# Patient Record
Sex: Female | Born: 1985 | Race: Black or African American | Hispanic: No | Marital: Single | Smoking: Current every day smoker
Health system: Southern US, Community
[De-identification: ages and names within clinical notes are randomized; demographics above are authoritative.]

## PROBLEM LIST (undated history)

## (undated) DIAGNOSIS — I10 Essential (primary) hypertension: Secondary | ICD-10-CM

## (undated) DIAGNOSIS — F99 Mental disorder, not otherwise specified: Secondary | ICD-10-CM

## (undated) DIAGNOSIS — R87619 Unspecified abnormal cytological findings in specimens from cervix uteri: Secondary | ICD-10-CM

## (undated) DIAGNOSIS — D649 Anemia, unspecified: Secondary | ICD-10-CM

## (undated) DIAGNOSIS — E559 Vitamin D deficiency, unspecified: Secondary | ICD-10-CM

## (undated) DIAGNOSIS — B009 Herpesviral infection, unspecified: Secondary | ICD-10-CM

## (undated) HISTORY — DX: Essential (primary) hypertension: I10

## (undated) HISTORY — DX: Mental disorder, not otherwise specified: F99

## (undated) HISTORY — DX: Herpesviral infection, unspecified: B00.9

---

## 2005-01-17 ENCOUNTER — Emergency Department: Payer: Self-pay | Admitting: Emergency Medicine

## 2005-01-18 ENCOUNTER — Ambulatory Visit: Payer: Self-pay | Admitting: Emergency Medicine

## 2005-04-05 ENCOUNTER — Emergency Department: Payer: Self-pay | Admitting: Emergency Medicine

## 2005-09-04 ENCOUNTER — Emergency Department: Payer: Self-pay | Admitting: Emergency Medicine

## 2005-12-26 ENCOUNTER — Emergency Department: Payer: Self-pay | Admitting: Emergency Medicine

## 2006-02-23 ENCOUNTER — Emergency Department: Payer: Self-pay | Admitting: Emergency Medicine

## 2006-06-01 ENCOUNTER — Observation Stay: Payer: Self-pay | Admitting: Obstetrics and Gynecology

## 2006-08-04 ENCOUNTER — Observation Stay: Payer: Self-pay

## 2006-08-12 ENCOUNTER — Observation Stay: Payer: Self-pay | Admitting: Obstetrics and Gynecology

## 2006-08-22 ENCOUNTER — Inpatient Hospital Stay: Payer: Self-pay

## 2007-01-09 ENCOUNTER — Emergency Department: Payer: Self-pay | Admitting: Emergency Medicine

## 2007-12-15 ENCOUNTER — Emergency Department (HOSPITAL_COMMUNITY): Admission: EM | Admit: 2007-12-15 | Discharge: 2007-12-15 | Payer: Self-pay | Admitting: Emergency Medicine

## 2008-05-05 ENCOUNTER — Emergency Department: Payer: Self-pay | Admitting: Emergency Medicine

## 2008-12-17 ENCOUNTER — Observation Stay: Payer: Self-pay | Admitting: Obstetrics and Gynecology

## 2008-12-19 ENCOUNTER — Observation Stay: Payer: Self-pay | Admitting: Obstetrics and Gynecology

## 2008-12-22 ENCOUNTER — Observation Stay: Payer: Self-pay | Admitting: Obstetrics and Gynecology

## 2009-02-02 ENCOUNTER — Observation Stay: Payer: Self-pay | Admitting: Obstetrics and Gynecology

## 2009-02-10 ENCOUNTER — Inpatient Hospital Stay: Payer: Self-pay

## 2011-06-20 LAB — URINALYSIS, ROUTINE W REFLEX MICROSCOPIC
Bilirubin Urine: NEGATIVE
Glucose, UA: NEGATIVE
Hgb urine dipstick: NEGATIVE
Ketones, ur: NEGATIVE
Nitrite: NEGATIVE
Protein, ur: NEGATIVE
Specific Gravity, Urine: 1.028
Urobilinogen, UA: 0.2
pH: 5.5

## 2011-06-20 LAB — PREGNANCY, URINE: Preg Test, Ur: NEGATIVE

## 2011-09-29 ENCOUNTER — Emergency Department: Payer: Self-pay | Admitting: Internal Medicine

## 2011-11-10 ENCOUNTER — Emergency Department: Payer: Self-pay | Admitting: Unknown Physician Specialty

## 2012-02-10 ENCOUNTER — Emergency Department: Payer: Self-pay | Admitting: *Deleted

## 2012-09-20 ENCOUNTER — Emergency Department: Payer: Self-pay | Admitting: Emergency Medicine

## 2013-11-21 ENCOUNTER — Emergency Department: Payer: Self-pay | Admitting: Internal Medicine

## 2013-11-23 ENCOUNTER — Emergency Department: Payer: Self-pay | Admitting: Emergency Medicine

## 2015-02-10 ENCOUNTER — Encounter: Payer: Self-pay | Admitting: Emergency Medicine

## 2015-02-10 ENCOUNTER — Emergency Department
Admission: EM | Admit: 2015-02-10 | Discharge: 2015-02-10 | Disposition: A | Discharge status: Home / Self Care | Payer: Worker's Compensation | Attending: Emergency Medicine | Admitting: Emergency Medicine

## 2015-02-10 DIAGNOSIS — S61031A Puncture wound without foreign body of right thumb without damage to nail, initial encounter: Secondary | ICD-10-CM | POA: Diagnosis not present

## 2015-02-10 DIAGNOSIS — Y998 Other external cause status: Secondary | ICD-10-CM | POA: Diagnosis not present

## 2015-02-10 DIAGNOSIS — Z720 Tobacco use: Secondary | ICD-10-CM | POA: Diagnosis not present

## 2015-02-10 DIAGNOSIS — Y9389 Activity, other specified: Secondary | ICD-10-CM | POA: Diagnosis not present

## 2015-02-10 DIAGNOSIS — W460XXA Contact with hypodermic needle, initial encounter: Secondary | ICD-10-CM | POA: Insufficient documentation

## 2015-02-10 DIAGNOSIS — Y9289 Other specified places as the place of occurrence of the external cause: Secondary | ICD-10-CM | POA: Insufficient documentation

## 2015-02-10 DIAGNOSIS — Z7721 Contact with and (suspected) exposure to potentially hazardous body fluids: Secondary | ICD-10-CM | POA: Diagnosis present

## 2015-02-10 DIAGNOSIS — IMO0002 Reserved for concepts with insufficient information to code with codable children: Secondary | ICD-10-CM

## 2015-02-10 HISTORY — DX: Vitamin D deficiency, unspecified: E55.9

## 2015-02-10 NOTE — ED Provider Notes (Signed)
[facility] Emergency Department Provider Note  ____________________________________________  Time seen: 7:40 PM  I have reviewed the triage vital signs and the nursing notes.   HISTORY  Chief Complaint Body Fluid Exposure    HPI Melody Li is a 29 y.o. female who was working at home. [employer] [place], when she sustained a needlestick injury to her right thumb. He was from an insulin flex pain that had just been used to give a patient a hypodermic insulin injection. The needle was not visibly contaminated. Ms. Katrinka BlazingSmith has no knowledge of the resident's hepatitis or HIV status. She denies any medical history, except for bronchitis. She has mild pain in the right thumb, similar to a glucometer check per her description, without any other symptoms     Past Medical History  Diagnosis Date  . Vitamin D deficiency     There are no active problems to display for this patient.   History reviewed. No pertinent past surgical history.  No current outpatient prescriptions on file.  Allergies Review of patient's allergies indicates no known allergies.  No family history on file.  Social History History  Substance Use Topics  . Smoking status: Current Every Day Smoker -- 1.00 packs/day    Types: Cigarettes  . Smokeless tobacco: Never Used  . Alcohol Use: Yes    Review of Systems  Constitutional: No fever or chills. No weight changes Eyes:No blurry vision or double vision.  ENT: No sore throat. Cardiovascular: No chest pain. Respiratory: No dyspnea or cough. Gastrointestinal: Negative for abdominal pain, vomiting and diarrhea.  No BRBPR or melena. Genitourinary: Negative for dysuria, urinary retention, bloody urine, or difficulty urinating. Musculoskeletal: Negative for back pain. No joint swelling or pain. Skin: Negative for rash. Neurological: Negative for headaches, focal weakness or numbness. Psychiatric:No anxiety or depression.    Endocrine:No hot/cold intolerance, changes in energy, or sleep difficulty.  10-point ROS otherwise negative.  ____________________________________________   PHYSICAL EXAM:  VITAL SIGNS: ED Triage Vitals  Enc Vitals Group     BP 02/10/15 1927 151/91 mmHg     Pulse Rate 02/10/15 1927 91     Resp 02/10/15 1927 18     Temp 02/10/15 1927 98.3 F (36.8 C)     Temp Source 02/10/15 1927 Oral     SpO2 02/10/15 1927 95 %     Weight 02/10/15 1927 181 lb (82.101 kg)     Height 02/10/15 1927 5\' 2"  (1.575 m)     Head Cir --      Peak Flow --      Pain Score --      Pain Loc --      Pain Edu? --      Excl. in GC? --      Constitutional: Alert and oriented. Well appearing and in no distress. Eyes: No scleral icterus. No conjunctival pallor. PERRL. EOMI ENT   Head: Normocephalic and atraumatic.  Musculoskeletal: Right upper extremity, hand and thumb all unremarkable. No visible injury or bleeding. Good perfusion Neurologic:   Normal speech and language.  Motor grossly intact. Normal gait. No gross focal neurologic deficits are appreciated.  Skin:  Skin is warm, dry and intact. No rash noted.  Psychiatric: Mood and affect are normal. Speech and behavior are normal. Patient exhibits appropriate insight and judgment. Not intoxicated. Normal interaction  ____________________________________________    LABS (pertinent positives/negatives) (all labs ordered are listed, but only abnormal results are displayed) Labs Reviewed - No data to display ____________________________________________  EKG    ____________________________________________    RADIOLOGY    ____________________________________________   PROCEDURES  ____________________________________________   INITIAL IMPRESSION / ASSESSMENT AND PLAN / ED COURSE  Pertinent labs & imaging results that were available during my care of the patient were reviewed by me and considered in my medical decision making  (see chart for details).  The patient presents with a low risk needle stick injury. This was a very small bore needle that is not used for blood draws and factors. He is only to use insulin into a hypodermic on the source resident. There is extremely low likelihood that any source resident. Blood was retained in the needle and then injected into our patient. Additionally, the needle stick injury to her patient's thumb appears to be very superficial and may not even extended all the way through the skin layer. I counseled the patient on these aspects of the injury and recommend against post exposure prophylaxis for HIV.  We will send labs via [facility] per protocol to get baseline and follow-up testing for HIV and hepatitis panels. I have low suspicion of any significant transmission even if the source resident currently has such infections.  There is no evidence of any functional impairment that may have clouded. The patient's judgment or sensorium and predisposed her to an injury. She is not intoxicated, is alert, mentally sharp and has appropriate insight and judgment.  ____________________________________________   FINAL CLINICAL IMPRESSION(S) / ED DIAGNOSES  Final diagnoses:  None   needlestick injury, possible body fluid exposure    Sharman CheekPhillip Davonne Jarnigan, MD 02/10/15 2023

## 2015-02-10 NOTE — ED Notes (Signed)
[facility] to right thumb while giving an insulin shot with flex-pen on a resident

## 2015-02-10 NOTE — ED Notes (Signed)
Patient with no complaints at this time. Respirations even and unlabored. Skin warm/dry. Discharge instructions reviewed with patient at this time. Patient given opportunity to voice concerns/ask questions. Patient discharged at this time and left Emergency Department with steady gait.   

## 2015-02-10 NOTE — Discharge Instructions (Signed)
Needle Stick Injury  A needle stick injury occurs when you are stuck by a needle that may have the blood from another person on it. Most of the time these injuries heal without any problem, but several diseases can be transmitted this way. You should be aware of the risks. A needle stick injury can cause risk for getting:  · Hepatitis B.  · Hepatitis C.  · HIV infection (the virus that causes AIDS).  The chance of getting one of these infections from a needle stick injury is small. However, it is important to take proper precautions to prevent such an injury. It is also important to understand and follow some health care recommendations when such an injury occurs.   RISK FACTORS  In general, the risk of infection after a needle stick injury appears to be higher with:  · Exposure to a needle that is visibly contaminated with blood.  · Exposure to the blood of a patient with an advanced disease or with a high viral load.  · A deep injury.  · Needle placement in a vein or artery.  PREVENTION   All health care workers should:  · Wash their hands often, including before and after caring for each patient.  · Receive the hepatitis B vaccine before any possible exposure to blood or bloody body fluids.  · Use personal protective equipment (PPE) when appropriate. This includes:  ¨ Gloves.  ¨ Gowns.  ¨ Boots.  ¨ Shoe covers.  ¨ Eyewear.  ¨ Masks.  · Wear gloves when any kind of venous or arterial access is being done.  · Use safety devices when available.  · Use sharp edges and needles with caution.  · Dispose of used needles and other sharps in puncture proof receptacles.  · Never recap needles.  TREATMENT   · After a needle stick injury, immediate cleansing with soap and water or an alcohol-based hand hygiene agent is needed.  · If you did not have a tetanus booster within the past 10 years, a booster shot should be given.  · If the puncture site becomes red, swollen, more painful, or drains yellowish-white fluid (pus),  medicine for a bacterial infection may be needed.  · If the blood on the needle is known or thought to be high risk for hepatitis B or HIV, additional treatment is needed.  · For needle stick exposures to the HIV virus, drug treatment is advised. This treatment is called post-exposure prophylaxis (PEP) and should be started as soon as possible following the injury. The recommended period of treatment with medicines is usually 4 weeks with 2 or more different drugs. You should have follow-up counseling and a medical evaluation, including HIV blood tests, right away. The tests should be repeated at 6 weeks, 3 months, and 6 months. Blood tests to monitor for drug toxicity effects of the PEP medicines are usually recommended immediately before treatment starts and again at 2 weeks and 4 weeks after the start of PEP. Additional recommendations during the first 6 to 12 weeks after exposure include:  ¨ Practicing sexual abstinence or using condoms to prevent sexual transmission and to avoid pregnancy.  ¨ Refraining from donating blood, plasma, semen, organs, or other tissue.  ¨ For breastfeeding women, considering temporary discontinuation of breastfeeding while on PEP.  · For needle stick exposures to hepatitis B, blood testing and PEP is also needed. If you have not been vaccinated against hepatitis B, this vaccine series should be started and hepatitis B immune   globulin should also be given. If you have been previously vaccinated, your status of immunity to infection with hepatitis B can be tested by a blood antibody test. Before or after those test results are available, repeat vaccination with or without hepatitis B immune globulin will be considered.  · Unfortunately, no helpful treatment following hepatitis C exposure has been identified or is recommended. Follow-up blood testing is advised over a period of 4 weeks to 6 months to determine if the needle stick led to an infection. Ask your caregiver for advice about  this follow-up testing.  HOME CARE INSTRUCTIONS  · Take medicine exactly as told by your caregiver.  · Keep all follow-up appointments.  · Do not share personal hygiene items.  SEEK IMMEDIATE MEDICAL CARE IF:  · You have concerns about your injury, treatment, or follow-up.  · The injury site becomes red, swollen, or painful.  · The injury site drains pus.  MAKE SURE YOU:  · Understand these instructions.  · Will watch your condition.  · Will get help right away if you are not doing well or get worse.  Document Released: 09/12/2005 Document Revised: 01/27/2014 Document Reviewed: 02/15/2011  [employer]® Patient Information ©2015 [employer]. This information is not intended to replace advice given to you by your health care provider. Make sure you discuss any questions you have with your health care provider.

## 2015-02-10 NOTE — ED Notes (Signed)
[employer] Bloodborne [facility].

## 2015-06-01 ENCOUNTER — Emergency Department
Admission: EM | Admit: 2015-06-01 | Discharge: 2015-06-01 | Disposition: A | Discharge status: Home / Self Care | Payer: BC Managed Care – PPO | Attending: Emergency Medicine | Admitting: Emergency Medicine

## 2015-06-01 ENCOUNTER — Encounter: Payer: Self-pay | Admitting: Emergency Medicine

## 2015-06-01 DIAGNOSIS — S99921A Unspecified injury of right foot, initial encounter: Secondary | ICD-10-CM | POA: Diagnosis present

## 2015-06-01 DIAGNOSIS — Y92007 Garden or yard of unspecified non-institutional (private) residence as the place of occurrence of the external cause: Secondary | ICD-10-CM | POA: Insufficient documentation

## 2015-06-01 DIAGNOSIS — S93401A Sprain of unspecified ligament of right ankle, initial encounter: Secondary | ICD-10-CM | POA: Diagnosis not present

## 2015-06-01 DIAGNOSIS — Z720 Tobacco use: Secondary | ICD-10-CM | POA: Insufficient documentation

## 2015-06-01 DIAGNOSIS — S93601A Unspecified sprain of right foot, initial encounter: Secondary | ICD-10-CM | POA: Diagnosis not present

## 2015-06-01 DIAGNOSIS — Y9302 Activity, running: Secondary | ICD-10-CM | POA: Diagnosis not present

## 2015-06-01 DIAGNOSIS — W1841XA Slipping, tripping and stumbling without falling due to stepping on object, initial encounter: Secondary | ICD-10-CM | POA: Insufficient documentation

## 2015-06-01 DIAGNOSIS — S93699A Other sprain of unspecified foot, initial encounter: Secondary | ICD-10-CM

## 2015-06-01 DIAGNOSIS — Y998 Other external cause status: Secondary | ICD-10-CM | POA: Diagnosis not present

## 2015-06-01 HISTORY — DX: Unspecified abnormal cytological findings in specimens from cervix uteri: R87.619

## 2015-06-01 HISTORY — DX: Anemia, unspecified: D64.9

## 2015-06-01 NOTE — ED Notes (Signed)
Reports right foot pain.  Stepped on something wrong a month ago.  Ambulates well

## 2015-06-01 NOTE — ED Provider Notes (Signed)
[facility] Emergency Department Provider Note ____________________________________________  Time seen: 0825   I have reviewed the triage vital signs and the nursing notes.  HISTORY  Chief Complaint  Foot Injury  HPI Melody Li is a 29 y.o. female reports to the ED for continued right plantar surface pain since she injured her foot better than a month ago.She describes she was running across the yard, when she inadvertently stepped on a rock that was in the yard causing pain to the bottom of the foot and a slight sprain to the ankle. She wrapped her ankle with Ace wrap and within a few days that had resolved. She continued since that time to have tenderness to the plantar surface of the heel, as well as pain across the arch of the foot. She also has noted and is visibly tender plantar surface of the foot upon awakening in the morning. She reports that her symptoms seem to be worsened when she pushes off with stepping as well as when she pulls her toes up towards her. Been dosing ibuprofen and Tylenol for pain relief. She is here for evaluation treatment of the symptoms.  Past Medical History  Diagnosis Date  . Vitamin D deficiency   . Abnormal Pap smear of cervix   . Vitamin D deficiency   . Anemia     There are no active problems to display for this patient.   History reviewed. No pertinent past surgical history.  No current outpatient prescriptions on file.  Allergies Review of patient's allergies indicates no known allergies.  History reviewed. No pertinent family history.  Social History Social History  Substance Use Topics  . Smoking status: Current Every Day Smoker -- 1.00 packs/day    Types: Cigarettes  . Smokeless tobacco: Never Used  . Alcohol Use: Yes   Review of Systems  Constitutional: Negative for fever. Eyes: Negative for visual changes. ENT: Negative for sore throat. Cardiovascular: Negative for chest pain. Respiratory:  Negative for shortness of breath. Gastrointestinal: Negative for abdominal pain, vomiting and diarrhea. Genitourinary: Negative for dysuria. Musculoskeletal: Negative for back pain. Right heel pain as above. Skin: Negative for rash. Neurological: Negative for headaches, focal weakness or numbness. ____________________________________________  PHYSICAL EXAM:  VITAL SIGNS: ED Triage Vitals  Enc Vitals Group     BP 06/01/15 0756 135/89 mmHg     Pulse Rate 06/01/15 0756 90     Resp 06/01/15 0756 20     Temp 06/01/15 0756 98.3 F (36.8 C)     Temp Source 06/01/15 0756 Oral     SpO2 06/01/15 0756 99 %     Weight 06/01/15 0756 178 lb (80.74 kg)     Height 06/01/15 0756 5\' 1"  (1.549 m)     Head Cir --      Peak Flow --      Pain Score 06/01/15 0753 7     Pain Loc --      Pain Edu? --      Excl. in GC? --    Constitutional: Alert and oriented. Well appearing and in no distress. Eyes: Conjunctivae are normal. PERRL. Normal extraocular movements. ENT   Head: Normocephalic and atraumatic.   Nose: No congestion/rhinorrhea.   Mouth/Throat: Mucous membranes are moist.   Neck: Supple. No thyromegaly. Hematological/Lymphatic/Immunological: No cervical lymphadenopathy. Cardiovascular: Normal rate, regular rhythm.  Respiratory: Normal respiratory effort. No wheezes/rales/rhonchi. Gastrointestinal: Soft and nontender. No distention. Musculoskeletal: Right foot and ankle without deformity, effusion, or bruising. Tender to palp over  the plantar surface of the heel. Tenderness with stretching of the plantar fascia. Nontender with normal range of motion in all extremities.  Neurologic:  Normal gait without ataxia. Normal speech and language. No gross focal neurologic deficits are appreciated. Skin:  Skin is warm, dry and intact. No rash noted. Psychiatric: Mood and affect are normal. Patient exhibits appropriate insight and  judgment. ________________________________________________  INITIAL IMPRESSION / ASSESSMENT AND PLAN / ED COURSE  Acute traumatic plantar fasciitis due to a remote contusion. Suggest over-the-counter heel lifts and a more rigid shoe to ambulate. Also gave instruction on plantar fasciitis management using ice massage, tissue mobilization, and stretching. Patient is referred to Dr. Recardo Evangelist for further treatment. ____________________________________________  FINAL CLINICAL IMPRESSION(S) / ED DIAGNOSES  Final diagnoses:  Traumatic plantar fasciitis     Lissa Hoard, PA-C 06/01/15 1630  Jennye Moccasin, MD 06/03/15 [phone]

## 2015-06-01 NOTE — Discharge Instructions (Signed)
Plantar Fasciitis (Heel Spur Syndrome) with Rehab The plantar fascia is a fibrous, ligament-like, soft-tissue structure that spans the bottom of the foot. Plantar fasciitis is a condition that causes pain in the foot due to inflammation of the tissue. SYMPTOMS   Pain and tenderness on the underneath side of the foot.  Pain that worsens with standing or walking. CAUSES  Plantar fasciitis is caused by irritation and injury to the plantar fascia on the underneath side of the foot. Common mechanisms of injury include:  Direct trauma to bottom of the foot.  Damage to a small nerve that runs under the foot where the main fascia attaches to the heel bone.  Stress placed on the plantar fascia due to bone spurs. RISK INCREASES WITH:   Activities that place stress on the plantar fascia (running, jumping, pivoting, or cutting).  Poor strength and flexibility.  Improperly fitted shoes.  Tight calf muscles.  Flat feet.  Failure to warm-up properly before activity.  Obesity. PREVENTION  Warm up and stretch properly before activity.  Allow for adequate recovery between workouts.  Maintain physical fitness:  Strength, flexibility, and endurance.  Cardiovascular fitness.  Maintain a health body weight.  Avoid stress on the plantar fascia.  Wear properly fitted shoes, including arch supports for individuals who have flat feet. PROGNOSIS  If treated properly, then the symptoms of plantar fasciitis usually resolve without surgery. However, occasionally surgery is necessary. RELATED COMPLICATIONS   Recurrent symptoms that may result in a chronic condition.  Problems of the lower back that are caused by compensating for the injury, such as limping.  Pain or weakness of the foot during push-off following surgery.  Chronic inflammation, scarring, and partial or complete fascia tear, occurring more often from repeated injections. TREATMENT  Treatment initially involves the use of  ice and medication to help reduce pain and inflammation. The use of strengthening and stretching exercises may help reduce pain with activity, especially stretches of the Achilles tendon. These exercises may be performed at home or with a therapist. Your caregiver may recommend that you use heel cups of arch supports to help reduce stress on the plantar fascia. Occasionally, corticosteroid injections are given to reduce inflammation. If symptoms persist for greater than 6 months despite non-surgical (conservative), then surgery may be recommended.  MEDICATION   If pain medication is necessary, then nonsteroidal anti-inflammatory medications, such as aspirin and ibuprofen, or other minor pain relievers, such as acetaminophen, are often recommended.  Do not take pain medication within 7 days before surgery.  Prescription pain relievers may be given if deemed necessary by your caregiver. Use only as directed and only as much as you need.  Corticosteroid injections may be given by your caregiver. These injections should be reserved for the most serious cases, because they may only be given a certain number of times. HEAT AND COLD  Cold treatment (icing) relieves pain and reduces inflammation. Cold treatment should be applied for 10 to 15 minutes every 2 to 3 hours for inflammation and pain and immediately after any activity that aggravates your symptoms. Use ice packs or massage the area with a piece of ice (ice massage).  Heat treatment may be used prior to performing the stretching and strengthening activities prescribed by your caregiver, physical therapist, or athletic trainer. Use a heat pack or soak the injury in warm water. SEEK IMMEDIATE MEDICAL CARE IF:  Treatment seems to offer no benefit, or the condition worsens.  Any medications produce adverse side effects. EXERCISES RANGE  OF MOTION (ROM) AND STRETCHING EXERCISES - Plantar Fasciitis (Heel Spur Syndrome) These exercises may help you  when beginning to rehabilitate your injury. Your symptoms may resolve with or without further involvement from your physician, physical therapist or athletic trainer. While completing these exercises, remember:   Restoring tissue flexibility helps normal motion to return to the joints. This allows healthier, less painful movement and activity.  An effective stretch should be held for at least 30 seconds.  A stretch should never be painful. You should only feel a gentle lengthening or release in the stretched tissue. RANGE OF MOTION - Toe Extension, Flexion  Sit with your right / left leg crossed over your opposite knee.  Grasp your toes and gently pull them back toward the top of your foot. You should feel a stretch on the bottom of your toes and/or foot.  Hold this stretch for __________ seconds.  Now, gently pull your toes toward the bottom of your foot. You should feel a stretch on the top of your toes and or foot.  Hold this stretch for __________ seconds. Repeat __________ times. Complete this stretch __________ times per day.  RANGE OF MOTION - Ankle Dorsiflexion, Active Assisted  Remove shoes and sit on a chair that is preferably not on a carpeted surface.  Place right / left foot under knee. Extend your opposite leg for support.  Keeping your heel down, slide your right / left foot back toward the chair until you feel a stretch at your ankle or calf. If you do not feel a stretch, slide your bottom forward to the edge of the chair, while still keeping your heel down.  Hold this stretch for __________ seconds. Repeat __________ times. Complete this stretch __________ times per day.  STRETCH - Gastroc, Standing  Place hands on wall.  Extend right / left leg, keeping the front knee somewhat bent.  Slightly point your toes inward on your back foot.  Keeping your right / left heel on the floor and your knee straight, shift your weight toward the wall, not allowing your back to  arch.  You should feel a gentle stretch in the right / left calf. Hold this position for __________ seconds. Repeat __________ times. Complete this stretch __________ times per day. STRETCH - Soleus, Standing  Place hands on wall.  Extend right / left leg, keeping the other knee somewhat bent.  Slightly point your toes inward on your back foot.  Keep your right / left heel on the floor, bend your back knee, and slightly shift your weight over the back leg so that you feel a gentle stretch deep in your back calf.  Hold this position for __________ seconds. Repeat __________ times. Complete this stretch __________ times per day. STRETCH - Gastrocsoleus, Standing  Note: This exercise can place a lot of stress on your foot and ankle. Please complete this exercise only if specifically instructed by your caregiver.   Place the ball of your right / left foot on a step, keeping your other foot firmly on the same step.  Hold on to the wall or a rail for balance.  Slowly lift your other foot, allowing your body weight to press your heel down over the edge of the step.  You should feel a stretch in your right / left calf.  Hold this position for __________ seconds.  Repeat this exercise with a slight bend in your right / left knee. Repeat __________ times. Complete this stretch __________ times per day.  STRENGTHENING EXERCISES - Plantar Fasciitis (Heel Spur Syndrome)  These exercises may help you when beginning to rehabilitate your injury. They may resolve your symptoms with or without further involvement from your physician, physical therapist or athletic trainer. While completing these exercises, remember:   Muscles can gain both the endurance and the strength needed for everyday activities through controlled exercises.  Complete these exercises as instructed by your physician, physical therapist or athletic trainer. Progress the resistance and repetitions only as guided. STRENGTH -  Towel Curls  Sit in a chair positioned on a non-carpeted surface.  Place your foot on a towel, keeping your heel on the floor.  Pull the towel toward your heel by only curling your toes. Keep your heel on the floor.  If instructed by your physician, physical therapist or athletic trainer, add ____________________ at the end of the towel. Repeat __________ times. Complete this exercise __________ times per day. STRENGTH - Ankle Inversion  Secure one end of a rubber exercise band/tubing to a fixed object (table, pole). Loop the other end around your foot just before your toes.  Place your fists between your knees. This will focus your strengthening at your ankle.  Slowly, pull your big toe up and in, making sure the band/tubing is positioned to resist the entire motion.  Hold this position for __________ seconds.  Have your muscles resist the band/tubing as it slowly pulls your foot back to the starting position. Repeat __________ times. Complete this exercises __________ times per day.  Document Released: 09/12/2005 Document Revised: 12/05/2011 Document Reviewed: 12/25/2008 [facility] Patient Information 2015 [place], [place]. This information is not intended to replace advice given to you by your health care provider. Make sure you discuss any questions you have with your health care provider.  Your condition is caused by you bruising and contusing the heel.  Consider OTC heel lifts as discussed. Also find a shoe with a thicker (firmer) sole. Use a frozen water bottle to massage the foot as demonstrated.

## 2016-11-14 ENCOUNTER — Emergency Department
Admission: EM | Admit: 2016-11-14 | Discharge: 2016-11-14 | Disposition: A | Discharge status: Home / Self Care | Payer: BC Managed Care – PPO | Attending: Emergency Medicine | Admitting: Emergency Medicine

## 2016-11-14 ENCOUNTER — Encounter: Payer: Self-pay | Admitting: Emergency Medicine

## 2016-11-14 DIAGNOSIS — F17210 Nicotine dependence, cigarettes, uncomplicated: Secondary | ICD-10-CM | POA: Diagnosis not present

## 2016-11-14 DIAGNOSIS — R05 Cough: Secondary | ICD-10-CM | POA: Diagnosis present

## 2016-11-14 DIAGNOSIS — J111 Influenza due to unidentified influenza virus with other respiratory manifestations: Secondary | ICD-10-CM | POA: Insufficient documentation

## 2016-11-14 MED ORDER — HYDROCOD POLST-CPM POLST ER 10-8 MG/5ML PO SUER
5.0000 mL | Freq: Once | ORAL | Status: AC
  Administered 2016-11-14: 5 mL via ORAL
  Filled 2016-11-14: qty 5

## 2016-11-14 MED ORDER — HYDROCOD POLST-CPM POLST ER 10-8 MG/5ML PO SUER: 5.0000 mL | Freq: Two times a day (BID) | ORAL | 0 refills | Status: DC

## 2016-11-14 MED ORDER — ALBUTEROL SULFATE HFA 108 (90 BASE) MCG/ACT IN AERS: 2.0000 | INHALATION_SPRAY | Freq: Four times a day (QID) | RESPIRATORY_TRACT | 2 refills | Status: DC | PRN

## 2016-11-14 MED ORDER — IPRATROPIUM-ALBUTEROL 0.5-2.5 (3) MG/3ML IN SOLN
3.0000 mL | Freq: Once | RESPIRATORY_TRACT | Status: AC
  Administered 2016-11-14: 3 mL via RESPIRATORY_TRACT
  Filled 2016-11-14: qty 3

## 2016-11-14 MED ORDER — OSELTAMIVIR PHOSPHATE 75 MG PO CAPS: 75.0000 mg | ORAL_CAPSULE | Freq: Two times a day (BID) | ORAL | 0 refills | Status: DC

## 2016-11-14 MED ORDER — HYDROCODONE-ACETAMINOPHEN 5-325 MG PO TABS: 1.0000 | ORAL_TABLET | ORAL | 0 refills | Status: DC | PRN

## 2016-11-14 MED ORDER — ACETAMINOPHEN 500 MG PO TABS
1000.0000 mg | ORAL_TABLET | Freq: Once | ORAL | Status: AC
  Administered 2016-11-14: 1000 mg via ORAL
  Filled 2016-11-14: qty 2

## 2016-11-14 MED ORDER — OSELTAMIVIR PHOSPHATE 75 MG PO CAPS: 75.0000 mg | ORAL_CAPSULE | Freq: Two times a day (BID) | ORAL | 0 refills | Status: AC

## 2016-11-14 NOTE — ED Notes (Signed)
See triage note  States she developed headache about 2 days ago  Now fever and cough with bodyaches

## 2016-11-14 NOTE — ED Triage Notes (Signed)
Pt to ed with c/o cough, congestion, and fever x 2 days.

## 2016-11-14 NOTE — Discharge Instructions (Signed)
Follow-up with your primary care doctor if not improving in 3 days. Continue taking ibuprofen for aches and fever. Tussionex 1 teaspoon twice a day for cough. This medication contains narcotics and do not take this medication and drive or operate machinery. Begin taking Tamiflu today. Continue taking Tylenol or ibuprofen for muscle aches and fever. Use inhaler as needed for cough or wheezing.

## 2016-11-14 NOTE — ED Provider Notes (Signed)
[facility] Emergency Department Provider Note   ____________________________________________   First MD Initiated Contact with Patient 11/14/16 [phone]     (approximate)  I have reviewed the triage vital signs and the nursing notes.   HISTORY  Chief Complaint Cough and Fever    HPI Melody Li is a 31 y.o. female is in the emergency room with complaint of severe cough, congestion and fever for the last 2 days. Patient states that everyone in the family has the flu. Patient also complains of a headache and generalized body aches. Patient been taking over-the-counter medication without any relief. Patient is a smoker at one half pack of cigarettes per day but has been unable to smoke since she got the flu. She states that she has had bronchitis in the past. Currently she rates her pain as a 10 over 10. Patient wants something to stop her cough immediately. She states that her brother dropped her off in the emergency room.   Past Medical History:  Diagnosis Date  . Abnormal Pap smear of cervix   . Anemia   . Vitamin D deficiency   . Vitamin D deficiency     There are no active problems to display for this patient.   History reviewed. No pertinent surgical history.  Prior to Admission medications   Medication Sig Start Date End Date Taking? Authorizing Provider  Cholecalciferol (VITAMIN D3) 5000 units TABS Take by mouth.   Yes Historical Provider, MD  metoprolol-hydrochlorothiazide (LOPRESSOR HCT) 50-25 MG tablet Take 1 tablet by mouth daily.   Yes Historical Provider, MD  albuterol (PROVENTIL HFA;VENTOLIN HFA) 108 (90 Base) MCG/ACT inhaler Inhale 2 puffs into the lungs every 6 (six) hours as needed for wheezing or shortness of breath. 11/14/16   Tommi Rumps, PA-C  chlorpheniramine-HYDROcodone (TUSSIONEX PENNKINETIC ER) 10-8 MG/5ML SUER Take 5 mLs by mouth 2 (two) times daily. 11/14/16   Tommi Rumps, PA-C  oseltamivir (TAMIFLU) 75 MG capsule  Take 1 capsule (75 mg total) by mouth 2 (two) times daily. 11/14/16 11/19/16  Tommi Rumps, PA-C    Allergies Patient has no known allergies.  History reviewed. No pertinent family history.  Social History Social History  Substance Use Topics  . Smoking status: Current Every Day Smoker    Packs/day: 1.00    Types: Cigarettes  . Smokeless tobacco: Never Used  . Alcohol use Yes    Review of Systems Constitutional: Positive fever/chills Eyes: No visual changes. ENT: Positive sore throat. Positive nasal congestion. Cardiovascular: Denies chest pain. Respiratory: Denies shortness of breath. Positive cough. Gastrointestinal: No abdominal pain.  No nausea, no vomiting.  No diarrhea. Musculoskeletal: Negative for back pain. Skin: Negative for rash. Neurological: Negative for headaches, focal weakness or numbness.  10-point ROS otherwise negative.  ____________________________________________   PHYSICAL EXAM:  VITAL SIGNS: ED Triage Vitals  Enc Vitals Group     BP 11/14/16 0913 (!) 149/85     Pulse Rate 11/14/16 0913 (!) 112     Resp 11/14/16 0913 (!) 22     Temp 11/14/16 0913 (!) 102.7 F (39.3 C)     Temp Source 11/14/16 0913 Oral     SpO2 11/14/16 0913 97 %     Weight 11/14/16 0908 178 lb (80.7 kg)     Height --      Head Circumference --      Peak Flow --      Pain Score 11/14/16 0908 10     Pain Loc --  Pain Edu? --      Excl. in GC? --     Constitutional: Alert and oriented. Sick appearing but in no acute distress. Frequent spasmodic cough Eyes: Conjunctivae are normal. PERRL. EOMI. Head: Atraumatic. Nose: Moderate congestion/rhinnorhea. Mouth/Throat: Mucous membranes are moist.  Oropharynx non-erythematous. Neck: No stridor.   Hematological/Lymphatic/Immunilogical: No cervical lymphadenopathy. Cardiovascular: Normal rate, regular rhythm. Grossly normal heart sounds.  Good peripheral circulation. Respiratory: Normal respiratory effort.  No  retractions. Lungs CTAB. Frequent coarse cough is heard but no wheezing is present. Gastrointestinal: Soft and nontender. No distention.  Musculoskeletal: Moves upper and lower extremities without difficulty. Normal gait was noted. Neurologic:  Normal speech and language. No gross focal neurologic deficits are appreciated. No gait instability. Skin:  Skin is warm, dry and intact. No rash noted. Psychiatric: Mood and affect are normal. Speech and behavior are normal.  ____________________________________________   LABS (all labs ordered are listed, but only abnormal results are displayed)  Labs Reviewed - No data to display  PROCEDURES  Procedure(s) performed: None  Procedures  Critical Care performed: No  ____________________________________________   INITIAL IMPRESSION / ASSESSMENT AND PLAN / ED COURSE  Pertinent labs & imaging results that were available during my care of the patient were reviewed by me and considered in my medical decision making (see chart for details).  Patient was given Tussionex in the emergency room to help with her cough along with 2 nebulizer treatments and got improvement of her cough. Patient was encouraged to discontinue smoking. She was given a prescription for Tamiflu 75 mg twice a day for 5 days along with prescription for Tussionex 1 teaspoon twice a day. Patient was also given a prescription for albuterol inhaler 2 puffs every 6 hours as needed for wheezing, shortness of breath or bronchospasms. Patient is follow-up with her primary care doctor if not improving in 3 days. She will continue to take Tylenol or ibuprofen as needed for fever, muscle aches or headache.   ____________________________________________   FINAL CLINICAL IMPRESSION(S) / ED DIAGNOSES  Final diagnoses:  Influenza      NEW MEDICATIONS STARTED DURING THIS VISIT:  Discharge Medication List as of 11/14/2016 11:27 AM    START taking these medications   Details    albuterol (PROVENTIL HFA;VENTOLIN HFA) 108 (90 Base) MCG/ACT inhaler Inhale 2 puffs into the lungs every 6 (six) hours as needed for wheezing or shortness of breath., Starting Mon 11/14/2016, Print         Note:  This document was prepared using Dragon voice recognition software and may include unintentional dictation errors.    Tommi RumpsRhonda L Joelynn Dust, PA-C 11/14/16 1553    Nita Sicklearolina Veronese, MD 11/17/16 [phone]

## 2017-01-05 ENCOUNTER — Emergency Department: Payer: 59

## 2017-01-05 ENCOUNTER — Emergency Department
Admission: EM | Admit: 2017-01-05 | Discharge: 2017-01-05 | Disposition: A | Discharge status: Home / Self Care | Payer: 59 | Attending: Student in an Organized Health Care Education/Training Program | Admitting: Student in an Organized Health Care Education/Training Program

## 2017-01-05 DIAGNOSIS — Z79899 Other long term (current) drug therapy: Secondary | ICD-10-CM | POA: Insufficient documentation

## 2017-01-05 DIAGNOSIS — Y929 Unspecified place or not applicable: Secondary | ICD-10-CM | POA: Diagnosis not present

## 2017-01-05 DIAGNOSIS — S62514A Nondisplaced fracture of proximal phalanx of right thumb, initial encounter for closed fracture: Secondary | ICD-10-CM | POA: Diagnosis not present

## 2017-01-05 DIAGNOSIS — Y9301 Activity, walking, marching and hiking: Secondary | ICD-10-CM | POA: Diagnosis not present

## 2017-01-05 DIAGNOSIS — Y999 Unspecified external cause status: Secondary | ICD-10-CM | POA: Diagnosis not present

## 2017-01-05 DIAGNOSIS — W108XXA Fall (on) (from) other stairs and steps, initial encounter: Secondary | ICD-10-CM | POA: Diagnosis not present

## 2017-01-05 DIAGNOSIS — S6991XA Unspecified injury of right wrist, hand and finger(s), initial encounter: Secondary | ICD-10-CM | POA: Diagnosis present

## 2017-01-05 DIAGNOSIS — F17210 Nicotine dependence, cigarettes, uncomplicated: Secondary | ICD-10-CM | POA: Diagnosis not present

## 2017-01-05 MED ORDER — HYDROCODONE-ACETAMINOPHEN 5-325 MG PO TABS: 1.0000 | ORAL_TABLET | Freq: Three times a day (TID) | ORAL | 0 refills | Status: DC | PRN

## 2017-01-05 MED ORDER — ACETAMINOPHEN 500 MG PO TABS
1000.0000 mg | ORAL_TABLET | Freq: Once | ORAL | Status: AC
  Administered 2017-01-05: 1000 mg via ORAL
  Filled 2017-01-05: qty 2

## 2017-01-05 NOTE — ED Triage Notes (Signed)
Pt reports tripping on stairs last night and fell onto right thumb. No obvious deformity noted.

## 2017-01-05 NOTE — ED Notes (Signed)
See triage note   States she missed a step last pm  Fell  Having pain and swelling to right thumb  Tender to touch  Positive swelling

## 2017-01-05 NOTE — Discharge Instructions (Signed)
You have broken (fractured) the middle bone of the thumb. You should wear the thumb splint until you are evaluated by ortho. Your work activities will be limited by the splint. Take the pain medicine or Tylenol for non-drowsy pain relief. Apply ice pack over the splint for swelling.

## 2017-01-05 NOTE — ED Provider Notes (Signed)
[facility] Emergency Department Provider Note ____________________________________________  Time seen: 1616  I have reviewed the triage vital signs and the nursing notes.  HISTORY  Chief Complaint  Thumb pain  HPI Melody Li is a 31 y.o.right-handed female presents to the ED for evaluation of right thumb pain. She reports tripping while walking up the steps last night. She somehow injured her thumb. She has had pain, swelling, deformity, and disabilitysince the accident. She reports pain at 8/10 at this time. She has dosed IBU 800 mg for pain relief. She attempted to report to work today. She denies any other significant injury.   Past Medical History:  Diagnosis Date  . Abnormal Pap smear of cervix   . Anemia   . Vitamin D deficiency   . Vitamin D deficiency     There are no active problems to display for this patient.   No past surgical history on file.  Prior to Admission medications   Medication Sig Start Date End Date Taking? Authorizing Provider  loratadine (CLARITIN) 10 MG tablet Take 10 mg by mouth daily.   Yes Historical Provider, MD  albuterol (PROVENTIL HFA;VENTOLIN HFA) 108 (90 Base) MCG/ACT inhaler Inhale 2 puffs into the lungs every 6 (six) hours as needed for wheezing or shortness of breath. 11/14/16   Tommi Rumps, PA-C  chlorpheniramine-HYDROcodone (TUSSIONEX PENNKINETIC ER) 10-8 MG/5ML SUER Take 5 mLs by mouth 2 (two) times daily. 11/14/16   Tommi Rumps, PA-C  Cholecalciferol (VITAMIN D3) 5000 units TABS Take by mouth.    Historical Provider, MD  HYDROcodone-acetaminophen (NORCO) 5-325 MG tablet Take 1 tablet by mouth 3 (three) times daily as needed. 01/05/17   Shawntae Lowy V Bacon Monserrat Vidaurri, PA-C  metoprolol-hydrochlorothiazide (LOPRESSOR HCT) 50-25 MG tablet Take 1 tablet by mouth daily.    Historical Provider, MD    Allergies Patient has no known allergies.  No family history on file.  Social History Social History  Substance  Use Topics  . Smoking status: Current Every Day Smoker    Packs/day: 1.00    Types: Cigarettes  . Smokeless tobacco: Never Used  . Alcohol use Yes    Review of Systems  Constitutional: Negative for fever. Cardiovascular: Negative for chest pain. Respiratory: Negative for shortness of breath. Musculoskeletal: Negative for back pain. Right thumb pain and disability as above.  Skin: Negative for rash. Neurological: Negative for headaches, focal weakness or numbness. ____________________________________________  PHYSICAL EXAM:  VITAL SIGNS: ED Triage Vitals  Enc Vitals Group     BP 01/05/17 1554 (!) 144/89     Pulse Rate 01/05/17 1554 94     Resp 01/05/17 1554 20     Temp 01/05/17 1554 98.8 F (37.1 C)     Temp Source 01/05/17 1554 Oral     SpO2 01/05/17 1554 96 %     Weight 01/05/17 1553 183 lb (83 kg)     Height 01/05/17 1553  (1.575 m)     Head Circumference --      Peak Flow --      Pain Score 01/05/17 1555 8     Pain Loc --      Pain Edu? --      Excl. in GC? --     Constitutional: Alert and oriented. Well appearing and in no distress. Head: Normocephalic and atraumatic. Eyes: Conjunctivae are normal. PERRL. Normal extraocular movements Cardiovascular: Normal rate, regular rhythm. Normal distal pulses. Respiratory: Normal respiratory effort.  Musculoskeletal: Right hand with subtle swelling  to the dorsoradial aspect. The right thumb with swelling and edema noted from the DIP to the MCP. Decreased thumb flexion noted.  Nontender with normal range of motion in all other extremities.  Neurologic:  Normal gross sensation. No gross focal neurologic deficits are appreciated. Skin:  Skin is warm, dry and intact. No rash noted. ____________________________________________   RADIOLOGY  Right Hand  IMPRESSION: Nondisplaced fracture first proximal phalanx.  I, Aya Geisel, Charlesetta Ivory, personally viewed and evaluated these images (plain radiographs) as part of my  medical decision making, as well as reviewing the written report by the radiologist. ____________________________________________  PROCEDURES  Tylenol 1000 mg PO Thumb spica splint OCL ____________________________________________  INITIAL IMPRESSION / ASSESSMENT AND PLAN / ED COURSE  Patient with initial fracture management of a closed, non-displaced proximal phalanx of the right thumb. She is placed in an OCL thumb spica. She is referred to ortho for further fracture care. A prescription for Norco is provided for pain relief. A work note limiting hand use due to splint, is also provided.  ____________________________________________  FINAL CLINICAL IMPRESSION(S) / ED DIAGNOSES  Final diagnoses:  Closed nondisplaced fracture of proximal phalanx of right thumb, initial encounter      Lissa Hoard, PA-C 01/05/17 1706    Willy Eddy, MD 01/05/17 2115

## 2017-12-10 ENCOUNTER — Emergency Department: Payer: Worker's Compensation

## 2017-12-10 ENCOUNTER — Emergency Department
Admission: EM | Admit: 2017-12-10 | Discharge: 2017-12-10 | Disposition: A | Discharge status: Home / Self Care | Payer: Worker's Compensation | Attending: Emergency Medicine | Admitting: Emergency Medicine

## 2017-12-10 ENCOUNTER — Encounter: Payer: Self-pay | Admitting: Emergency Medicine

## 2017-12-10 ENCOUNTER — Other Ambulatory Visit: Payer: Self-pay

## 2017-12-10 DIAGNOSIS — Y929 Unspecified place or not applicable: Secondary | ICD-10-CM | POA: Insufficient documentation

## 2017-12-10 DIAGNOSIS — F17210 Nicotine dependence, cigarettes, uncomplicated: Secondary | ICD-10-CM | POA: Insufficient documentation

## 2017-12-10 DIAGNOSIS — S199XXA Unspecified injury of neck, initial encounter: Secondary | ICD-10-CM | POA: Diagnosis present

## 2017-12-10 DIAGNOSIS — Y999 Unspecified external cause status: Secondary | ICD-10-CM | POA: Insufficient documentation

## 2017-12-10 DIAGNOSIS — Y939 Activity, unspecified: Secondary | ICD-10-CM | POA: Diagnosis not present

## 2017-12-10 DIAGNOSIS — Z79899 Other long term (current) drug therapy: Secondary | ICD-10-CM | POA: Insufficient documentation

## 2017-12-10 DIAGNOSIS — S39012A Strain of muscle, fascia and tendon of lower back, initial encounter: Secondary | ICD-10-CM | POA: Insufficient documentation

## 2017-12-10 DIAGNOSIS — S161XXA Strain of muscle, fascia and tendon at neck level, initial encounter: Secondary | ICD-10-CM | POA: Diagnosis not present
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
## 2020-03-11 ENCOUNTER — Ambulatory Visit: Payer: Self-pay | Admitting: Dermatology

## 2020-03-16 ENCOUNTER — Emergency Department
Admission: EM | Admit: 2020-03-16 | Discharge: 2020-03-16 | Disposition: A | Discharge status: Home / Self Care | Payer: Self-pay | Attending: Emergency Medicine | Admitting: Emergency Medicine

## 2020-03-16 ENCOUNTER — Encounter: Payer: Self-pay | Admitting: Emergency Medicine

## 2020-03-16 ENCOUNTER — Other Ambulatory Visit: Payer: Self-pay

## 2020-03-16 DIAGNOSIS — J45909 Unspecified asthma, uncomplicated: Secondary | ICD-10-CM | POA: Insufficient documentation

## 2020-03-16 DIAGNOSIS — F17210 Nicotine dependence, cigarettes, uncomplicated: Secondary | ICD-10-CM | POA: Insufficient documentation

## 2020-03-16 DIAGNOSIS — J9801 Acute bronchospasm: Secondary | ICD-10-CM | POA: Insufficient documentation

## 2020-03-16 DIAGNOSIS — Z79899 Other long term (current) drug therapy: Secondary | ICD-10-CM | POA: Insufficient documentation

## 2020-03-16 MED ORDER — ALBUTEROL SULFATE HFA 108 (90 BASE) MCG/ACT IN AERS: 2.0000 | INHALATION_SPRAY | Freq: Four times a day (QID) | RESPIRATORY_TRACT | 1 refills | Status: AC | PRN

## 2020-03-16 MED ORDER — BENZONATATE 100 MG PO CAPS: 200.0000 mg | ORAL_CAPSULE | Freq: Three times a day (TID) | ORAL | 0 refills | Status: DC | PRN

## 2020-03-16 MED ORDER — METHYLPREDNISOLONE SODIUM SUCC 125 MG IJ SOLR
125.0000 mg | Freq: Once | INTRAMUSCULAR | Status: AC
  Administered 2020-03-16: 125 mg via INTRAMUSCULAR
  Filled 2020-03-16: qty 2

## 2020-03-16 MED ORDER — ALBUTEROL SULFATE HFA 108 (90 BASE) MCG/ACT IN AERS: 2.0000 | INHALATION_SPRAY | Freq: Four times a day (QID) | RESPIRATORY_TRACT | 1 refills | Status: DC | PRN

## 2020-03-16 NOTE — Discharge Instructions (Addendum)
Follow discharge care instruction take medications as directed. °

## 2020-03-16 NOTE — ED Triage Notes (Signed)
States she developed cough 2-3 weeks ago  Was placed on steroids    Then was slightly better with occasional   Stats the cough has gotten worse on the past 3 days

## 2020-03-16 NOTE — ED Provider Notes (Signed)
[facility] Emergency Department Provider Note   ____________________________________________   First MD Initiated Contact with Patient 03/16/20 1307     (approximate)  I have reviewed the triage vital signs and the nursing notes.   HISTORY  Chief Complaint Cough    HPI Melody Li is a 34 y.o. female patient complain of productive cough for 2 to 3 weeks.  Patient states 2 weeks ago she was placed on steroids which gave her mild relief but the cough returned.  Patient states cough is worsening in the past 3 days.  Patient states cough increased with deep inspirations.  Patient states history of asthma but denies wheezing.         Past Medical History:  Diagnosis Date  . Abnormal Pap smear of cervix   . Anemia   . Vitamin D deficiency   . Vitamin D deficiency     There are no problems to display for this patient.   History reviewed. No pertinent surgical history.  Prior to Admission medications   Medication Sig Start Date End Date Taking? Authorizing Provider  albuterol (VENTOLIN HFA) 108 (90 Base) MCG/ACT inhaler Inhale 2 puffs into the lungs every 6 (six) hours as needed for wheezing or shortness of breath. 03/16/20   Sable Feil, PA-C  benzonatate (TESSALON PERLES) 100 MG capsule Take 2 capsules (200 mg total) by mouth 3 (three) times daily as needed. 03/16/20 03/16/21  Sable Feil, PA-C  Cholecalciferol (VITAMIN D3) 5000 units TABS Take by mouth.    [provider]  loratadine (CLARITIN) 10 MG tablet Take 10 mg by mouth daily.    [provider]  metoprolol-hydrochlorothiazide (LOPRESSOR HCT) 50-25 MG tablet Take 1 tablet by mouth daily.    [provider]    Allergies Patient has no known allergies.  No family history on file.  Social History Social History   Tobacco Use  . Smoking status: Current Every Day Smoker    Packs/day: 1.00    Types: Cigarettes  . Smokeless tobacco: Never Used    Substance Use Topics  . Alcohol use: Yes  . Drug use: No    Review of Systems Constitutional: No fever/chills Eyes: No visual changes. ENT: No sore throat. Cardiovascular: Denies chest pain. Respiratory: Denies shortness of breath.  Cough with deep inspirations. Gastrointestinal: No abdominal pain.  No nausea, no vomiting.  No diarrhea.  No constipation. Genitourinary: Negative for dysuria. Musculoskeletal: Negative for back pain. Skin: Negative for rash. Neurological: Negative for headaches, focal weakness or numbness.  ____________________________________________   PHYSICAL EXAM:  VITAL SIGNS: ED Triage Vitals [03/16/20 1256]  Enc Vitals Group     BP (!) 157/91     Pulse Rate 86     Resp 18     Temp 98.7 F (37.1 C)     Temp src      SpO2 100 %     Weight 170 lb (77.1 kg)     Height 5\' 2"  (1.575 m)     Head Circumference      Peak Flow      Pain Score      Pain Loc      Pain Edu?      Excl. in [place]?    Constitutional: Alert and oriented. Well appearing and in no acute distress. Nose: No congestion/rhinnorhea. Mouth/Throat: Mucous membranes are moist.  Oropharynx non-erythematous. Neck: No stridor.  Hematological/Lymphatic/Immunilogical: No cervical lymphadenopathy. Cardiovascular: Normal rate, regular rhythm. Grossly normal heart sounds.  Good peripheral circulation.  Elevated blood pressure Respiratory: Normal respiratory effort.  No retractions. Lungs CTAB. Skin:  Skin is warm, dry and intact. No rash noted.  ____________________________________________   LABS (all labs ordered are listed, but only abnormal results are displayed)  Labs Reviewed - No data to display ____________________________________________  EKG   ____________________________________________  RADIOLOGY  ED MD interpretation:    Official radiology report(s): No results found.  ____________________________________________   PROCEDURES  Procedure(s) performed (including  Critical Care):  Procedures   ____________________________________________   INITIAL IMPRESSION / ASSESSMENT AND PLAN / ED COURSE  As part of my medical decision making, I reviewed the following data within the electronic MEDICAL RECORD NUMBER     Patient presents with 2 to 3 weeks of intermitting productive nonproductive cough.  Patient states she responded well to steroids but was not given any cough medicine.  Patient stated for the last course of steroids approximately 2 weeks ago.  Patient physical exam and complaint consistent with bronchospasms.  Patient given Solu-Medrol IM and a prescription for Tessalon Perles and Ventolin.  Patient advised follow-up with PCP.    Melody Li was evaluated in Emergency Department on 03/16/2020 for the symptoms described in the history of present illness. She was evaluated in the context of the global COVID-19 pandemic, which necessitated consideration that the patient might be at risk for infection with the SARS-CoV-2 virus that causes COVID-19. Institutional protocols and algorithms that pertain to the evaluation of patients at risk for COVID-19 are in a state of rapid change based on information released by regulatory bodies including the CDC and federal and state organizations. These policies and algorithms were followed during the patient's care in the ED.       ____________________________________________   FINAL CLINICAL IMPRESSION(S) / ED DIAGNOSES  Final diagnoses:  Cough due to bronchospasm     ED Discharge Orders         Ordered    benzonatate (TESSALON PERLES) 100 MG capsule  3 times daily PRN     Discontinue  Reprint     03/16/20 1318    albuterol (VENTOLIN HFA) 108 (90 Base) MCG/ACT inhaler  Every 6 hours PRN,   Status:  Discontinued     Reprint     03/16/20 1318    albuterol (VENTOLIN HFA) 108 (90 Base) MCG/ACT inhaler  Every 6 hours PRN     Discontinue  Reprint     03/16/20 1319           Note:  This document was  prepared using Dragon voice recognition software and may include unintentional dictation errors.    Jakara, Blatter, PA-C 03/16/20 1326    Emily Filbert, MD 03/16/20 [phone]

## 2020-05-20 ENCOUNTER — Emergency Department: Payer: Self-pay

## 2020-05-20 ENCOUNTER — Encounter: Payer: Self-pay | Admitting: Emergency Medicine

## 2020-05-20 ENCOUNTER — Other Ambulatory Visit: Payer: Self-pay

## 2020-05-20 ENCOUNTER — Emergency Department
Admission: EM | Admit: 2020-05-20 | Discharge: 2020-05-20 | Disposition: A | Discharge status: Home / Self Care | Payer: Self-pay | Attending: Emergency Medicine | Admitting: Emergency Medicine

## 2020-05-20 DIAGNOSIS — F17210 Nicotine dependence, cigarettes, uncomplicated: Secondary | ICD-10-CM | POA: Insufficient documentation

## 2020-05-20 DIAGNOSIS — R0789 Other chest pain: Secondary | ICD-10-CM | POA: Insufficient documentation

## 2020-05-20 DIAGNOSIS — R079 Chest pain, unspecified: Secondary | ICD-10-CM | POA: Insufficient documentation

## 2020-05-20 DIAGNOSIS — B349 Viral infection, unspecified: Secondary | ICD-10-CM | POA: Insufficient documentation

## 2020-05-20 DIAGNOSIS — Z7951 Long term (current) use of inhaled steroids: Secondary | ICD-10-CM | POA: Insufficient documentation

## 2020-05-20 DIAGNOSIS — Z79899 Other long term (current) drug therapy: Secondary | ICD-10-CM | POA: Insufficient documentation

## 2020-05-20 DIAGNOSIS — R058 Other specified cough: Secondary | ICD-10-CM

## 2020-05-20 DIAGNOSIS — J209 Acute bronchitis, unspecified: Secondary | ICD-10-CM | POA: Insufficient documentation

## 2020-05-20 LAB — BASIC METABOLIC PANEL
Anion gap: 7 (ref 5–15)
BUN: 8 mg/dL (ref 6–20)
CO2: 26 mmol/L (ref 22–32)
Calcium: 8.8 mg/dL — ABNORMAL LOW (ref 8.9–10.3)
Chloride: 104 mmol/L (ref 98–111)
Creatinine, Ser: 0.86 mg/dL (ref 0.44–1.00)
GFR calc Af Amer: >60 mL/min (ref >60)
GFR calc non Af Amer: >60 mL/min (ref >60)
Glucose, Bld: 107 mg/dL — ABNORMAL HIGH (ref 70–99)
Potassium: 3.5 mmol/L (ref 3.5–5.1)
Sodium: 137 mmol/L (ref 135–145)

## 2020-05-20 LAB — CBC
HCT: 36.1 % (ref 36.0–46.0)
Hemoglobin: 11.8 g/dL — ABNORMAL LOW (ref 12.0–15.0)
MCH: 30.1 pg (ref 26.0–34.0)
MCHC: 32.7 g/dL (ref 30.0–36.0)
MCV: 92.1 fL (ref 80.0–100.0)
Platelets: 256 10*3/uL (ref 150–400)
RBC: 3.92 MIL/uL (ref 3.87–5.11)
RDW: 13.9 % (ref 11.5–15.5)
WBC: 6 10*3/uL (ref 4.0–10.5)
nRBC: 0 % (ref 0.0–0.2)

## 2020-05-20 LAB — TROPONIN I (HIGH SENSITIVITY): Troponin I (High Sensitivity): <2 ng/L (ref <18)

## 2020-05-20 MED ORDER — BENZONATATE 100 MG PO CAPS
200.0000 mg | ORAL_CAPSULE | Freq: Once | ORAL | Status: AC
  Administered 2020-05-20: 200 mg via ORAL
  Filled 2020-05-20: qty 2

## 2020-05-20 MED ORDER — ACETAMINOPHEN 500 MG PO TABS
1000.0000 mg | ORAL_TABLET | Freq: Once | ORAL | Status: AC
  Administered 2020-05-20: 1000 mg via ORAL
  Filled 2020-05-20: qty 2

## 2020-05-20 MED ORDER — GUAIFENESIN ER 600 MG PO TB12
1200.0000 mg | ORAL_TABLET | Freq: Once | ORAL | Status: AC
  Administered 2020-05-20: 1200 mg via ORAL
  Filled 2020-05-20: qty 2

## 2020-05-20 MED ORDER — KETOROLAC TROMETHAMINE 30 MG/ML IJ SOLN
30.0000 mg | Freq: Once | INTRAMUSCULAR | Status: AC
  Administered 2020-05-20: 30 mg via INTRAMUSCULAR
  Filled 2020-05-20: qty 1

## 2020-05-20 NOTE — Discharge Instructions (Addendum)
Please take Tylenol and ibuprofen/Advil for your pain.  It is safe to take them together, or to alternate them every few hours.  Take up to 1000mg  of Tylenol at a time, up to 4 times per day.  Do not take more than 4000 mg of Tylenol in 24 hours.  For ibuprofen, take 400-600 mg, 4-5 times per day.  I would also add sudafed to this.  This is a medicine you can buy over-the-counter, though frequently behind the counter at the pharmacist.  Please take as prescribed and this will help clear your mucus and help with your cough at night in particular.  Return to the ED with any fevers or worsening symptoms despite these medications.

## 2020-05-20 NOTE — ED Provider Notes (Signed)
[facility] Emergency Department Provider Note ____________________________________________   First MD Initiated Contact with Patient 05/20/20 1311     (approximate)  I have reviewed the triage vital signs and the nursing notes.  HISTORY  Chief Complaint Chest Pain   HPI Melody Li is a 34 y.o. femalewho presents to the ED for evaluation of chest pain.  Chart review indicates history of seasonal allergies.  Patient reports being treated for 2 previous sinus infections in the past 2 months.  She reports receiving oral dose of steroids with improvement of the symptoms the first time.  A single steroid shot the second time.  She reports transient improvement of her symptoms, until this past 1 week.   Patient ports 1 week of "chest congestion" with associated productive cough of thick white sputum.  She denies upper respiratory congestion.  She reports associated chest tightness with her coughing that is 4/10 intensity substernal tightness that is nonradiating.  She denies chest pain at this time.  She denies shortness of breath, fevers, syncope.  She does report also associated posttussive emesis of nonbloody nonbilious emesis.  She reports the symptoms are worse at night, and keep her from sleeping.  She reports last night was "the worst" in the past 1 week.  Past Medical History:  Diagnosis Date  . Abnormal Pap smear of cervix   . Anemia   . Vitamin D deficiency   . Vitamin D deficiency     There are no problems to display for this patient.   History reviewed. No pertinent surgical history.  Prior to Admission medications   Medication Sig Start Date End Date Taking? Authorizing Provider  albuterol (VENTOLIN HFA) 108 (90 Base) MCG/ACT inhaler Inhale 2 puffs into the lungs every 6 (six) hours as needed for wheezing or shortness of breath. 03/16/20   Joni Reining, PA-C  Cholecalciferol (VITAMIN D3) 5000 units TABS Take by mouth.    [provider]  loratadine (CLARITIN) 10 MG tablet Take 10 mg by mouth daily.    [provider]  metoprolol-hydrochlorothiazide (LOPRESSOR HCT) 50-25 MG tablet Take 1 tablet by mouth daily.    [provider]    Allergies Shellfish allergy  No family history on file.  Social History Social History   Tobacco Use  . Smoking status: Current Every Day Smoker    Packs/day: 1.00    Types: Cigarettes  . Smokeless tobacco: Never Used  Substance Use Topics  . Alcohol use: Yes  . Drug use: No    Review of Systems  Constitutional: No fever/chills Eyes: No visual changes. ENT: No sore throat. Cardiovascular: Positive for chest pain. Respiratory: Denies shortness of breath.  Positive for productive cough. Gastrointestinal: No abdominal pain.  No nausea, no vomiting.  No diarrhea.  No constipation. Genitourinary: Negative for dysuria. Musculoskeletal: Negative for back pain. Skin: Negative for rash. Neurological: Negative for headaches, focal weakness or numbness.   ____________________________________________   PHYSICAL EXAM:  VITAL SIGNS: Vitals:   05/20/20 1316  BP: (!) 165/100  Pulse: 82  Resp: 18  Temp: 98 F (36.7 C)  SpO2: 98%      Constitutional: Alert and oriented. Well appearing and in no acute distress.  No coughing noted throughout our conversation and examination. Eyes: Conjunctivae are normal. PERRL. EOMI. Head: Atraumatic. Nose: No congestion/rhinnorhea. Mouth/Throat: Mucous membranes are moist.  Oropharynx non-erythematous.  Uvula is midline and tonsils are 1+ bilaterally without exudate. Neck: No stridor. No cervical spine tenderness to  palpation. Cardiovascular: Normal rate, regular rhythm. Grossly normal heart sounds.  Good peripheral circulation. Respiratory: Normal respiratory effort.  No retractions. Lungs CTAB. Gastrointestinal: Soft , nondistended, nontender to palpation. No abdominal bruits. No CVA  tenderness. Musculoskeletal: No lower extremity tenderness nor edema.  No joint effusions. No signs of acute trauma. Neurologic:  Normal speech and language. No gross focal neurologic deficits are appreciated. No gait instability noted. Skin:  Skin is warm, dry and intact. No rash noted. Psychiatric: Mood and affect are normal. Speech and behavior are normal.  ____________________________________________   LABS (all labs ordered are listed, but only abnormal results are displayed)  Labs Reviewed  BASIC METABOLIC PANEL - Abnormal; Notable for the following components:      Result Value   Glucose, Bld 107 (*)    Calcium 8.8 (*)    All other components within normal limits  CBC - Abnormal; Notable for the following components:   Hemoglobin 11.8 (*)    All other components within normal limits  TROPONIN I (HIGH SENSITIVITY)  TROPONIN I (HIGH SENSITIVITY)   ____________________________________________  12 Lead EKG Sinus rhythm, rate of 80 bpm, normal axis and intervals.  No evidence of acute ischemia.  ____________________________________________  RADIOLOGY  ED MD interpretation: 2 view CXR with no evidence of acute cardiopulmonary pathology  Official radiology report(s): DG Chest 2 View  Result Date: 05/20/2020 CLINICAL DATA:  Cough. EXAM: CHEST - 2 VIEW COMPARISON:  November 11, 2011. FINDINGS: The heart size and mediastinal contours are within normal limits. Both lungs are clear. The visualized skeletal structures are unremarkable. IMPRESSION: No active cardiopulmonary disease. Electronically Signed   By: Lupita Raider M.D.   On: 05/20/2020 10:11    ____________________________________________   PROCEDURES and INTERVENTIONS  Procedure(s) performed (including Critical Care):  Procedures  Medications  acetaminophen (TYLENOL) tablet 1,000 mg (1,000 mg Oral Given 05/20/20 1349)  ketorolac (TORADOL) 30 MG/ML injection 30 mg (30 mg Intramuscular Given 05/20/20 1350)   benzonatate (TESSALON) capsule 200 mg (200 mg Oral Given 05/20/20 1349)  guaiFENesin (MUCINEX) 12 hr tablet 1,200 mg (1,200 mg Oral Given 05/20/20 1349)    ____________________________________________   MDM / ED COURSE  34 year old woman who is otherwise healthy presenting with a cough and chest pain were consistent with acute bronchitis, without evidence of ACS or more serious underlying illness, and amenable to outpatient management with OTC medications.  Normal vital signs on room air.  Examination without evidence of acute pathology.  She is in no distress, she is speaking in full sentences and is not coughing during our conversation.  Blood work reassuring without significant derangements.  Nonischemic EKG.  CXR without pneumonia, PTX or mediastinal pathology.  Advised patient to add Sudafed and NSAIDs to her medication regimen of OTC medications.  We discussed outpatient management and return precautions for the ED.  Patient medical stable discharge home. ____________________________________________   FINAL CLINICAL IMPRESSION(S) / ED DIAGNOSES  Final diagnoses:  Viral syndrome  Acute bronchitis, unspecified organism  Other chest pain  Nonproductive cough     ED Discharge Orders    None       Jasani Lengel Petre   Note:  This document was prepared using Dragon voice recognition software and may include unintentional dictation errors.   Delton [place], MD 05/20/20 1400

## 2020-05-20 NOTE — ED Notes (Signed)
Pt alert and oriented X 4, stable for discharge. RR even and unlabored, color WNL. Discussed discharge instructions and follow-up as directed. Discharge medications discussed if provided. Pt had opportunity to ask questions if necessary and RN to provide patient/family eduction.  

## 2020-05-20 NOTE — ED Triage Notes (Signed)
Pt reports chest tightness and cough for a couple of months. Pt reports chest tightness has gotten worse over the last 2 weeks and she is now coughing up thick mucous. Pt denies fevers. Pt reports has a COVID test every Tuesday and they are all negative.

## 2020-06-26 ENCOUNTER — Encounter: Payer: Self-pay | Admitting: Physician Assistant

## 2020-06-26 ENCOUNTER — Ambulatory Visit: Payer: Self-pay | Admitting: Physician Assistant

## 2020-06-26 ENCOUNTER — Other Ambulatory Visit: Payer: Self-pay

## 2020-06-26 DIAGNOSIS — Z113 Encounter for screening for infections with a predominantly sexual mode of transmission: Secondary | ICD-10-CM

## 2020-06-26 DIAGNOSIS — N760 Acute vaginitis: Secondary | ICD-10-CM

## 2020-06-26 DIAGNOSIS — B9689 Other specified bacterial agents as the cause of diseases classified elsewhere: Secondary | ICD-10-CM

## 2020-06-26 LAB — WET PREP FOR TRICH, YEAST, CLUE
Trichomonas Exam: NEGATIVE
Yeast Exam: NEGATIVE

## 2020-06-26 MED ORDER — METRONIDAZOLE 500 MG PO TABS: 500.0000 mg | ORAL_TABLET | Freq: Two times a day (BID) | ORAL | 0 refills | Status: AC

## 2020-06-26 NOTE — Progress Notes (Signed)
[facility] Department STI clinic/screening visit  Subjective:  Melody Li is a 34 y.o. female being seen today for an STI screening visit. The patient reports they do have symptoms.  Patient reports that they do not desire a pregnancy in the next year.   They reported they are not interested in discussing contraception today.  No LMP recorded. Patient has had an implant.   Patient has the following medical conditions:  There are no problems to display for this patient.   Chief Complaint  Patient presents with  . SEXUALLY TRANSMITTED DISEASE    screening    HPI  Patient reports that she has had some itching for 4-5 days.  Denies other symptoms.  States that she had her last pap < 5 years ago and her last HIV test was about 2 years ago.  Reports that she has a Nexplanon for BCM.     See flowsheet for further details and programmatic requirements.    The following portions of the patient's history were reviewed and updated as appropriate: allergies, current medications, past medical history, past social history, past surgical history and problem list.  Objective:  There were no vitals filed for this visit.  Physical Exam Constitutional:      General: She is not in acute distress.    Appearance: Normal appearance.  HENT:     Head: Normocephalic and atraumatic.     Comments: No nits,lice, or hair loss. No cervical, supraclavicular or axillary adenopathy.    Mouth/Throat:     Mouth: Mucous membranes are moist.     Pharynx: Oropharynx is clear. No oropharyngeal exudate or posterior oropharyngeal erythema.  Eyes:     Conjunctiva/sclera: Conjunctivae normal.  Pulmonary:     Effort: Pulmonary effort is normal.  Abdominal:     Palpations: Abdomen is soft. There is no mass.     Tenderness: There is no abdominal tenderness. There is no guarding or rebound.  Genitourinary:    General: Normal vulva.     Rectum: Normal.     Comments: External genitalia/pubic area  without nits, lice, edema, erythema, lesions and inguinal adenopathy. Vagina with normal mucosa and small amount of clear/whitish discharge, pH=>4.5. Cervix without visible lesions. Uterus firm, mobile, nt, no masses, no CMT, no adnexal tenderness or fullness. Musculoskeletal:     Cervical back: Neck supple. No tenderness.  Skin:    General: Skin is warm and dry.     Findings: No bruising, erythema, lesion or rash.  Neurological:     Mental Status: She is alert and oriented to person, place, and time.  Psychiatric:        Mood and Affect: Mood normal.        Thought Content: Thought content normal.        Judgment: Judgment normal.      Assessment and Plan:  Melody Li is a 34 y.o. female presenting to the [facility] Department for STI screening  1. Screening for STD (sexually transmitted disease) Patient into clinic with symptoms. Rec condoms with all sex. Await test results.  Counseled that RN will call if needs to RTC for treatment once results are back. - WET PREP FOR TRICH, YEAST, CLUE - Chlamydia/Gonorrhea [place] [facility] - HIV [place] [facility] - Syphilis Serology, [place] [place] [facility]  2. BV (bacterial vaginosis) Will treat for BV with Metronidazole 500 mg #14 1 po BID for 7 days with food, no EtOH for 24 hr before and until 72 hr  after completing medicine. No sex for 10 days. Enc to use OTC antifungal cream if itching worsens during or just after completing antibiotics. - metroNIDAZOLE (FLAGYL) 500 MG tablet; Take 1 tablet (500 mg total) by mouth 2 (two) times daily for 7 days.  Dispense: 14 tablet; Refill: 0     No follow-ups on file.  No future appointments.  Matt Holmes, PA

## 2020-06-28 NOTE — Progress Notes (Signed)
Chart reviewed by Pharmacist  Suzanne Walker PharmD, Contract Pharmacist at [facility]  

## 2020-06-29 ENCOUNTER — Other Ambulatory Visit: Payer: Self-pay | Admitting: Family Medicine

## 2020-06-29 DIAGNOSIS — N760 Acute vaginitis: Secondary | ICD-10-CM

## 2020-06-30 MED ORDER — METRONIDAZOLE 0.75 % VA GEL: 1.0000 | Freq: Every day | VAGINAL | 0 refills | Status: AC

## 2020-06-30 NOTE — Telephone Encounter (Signed)
Patient into clinic after calling stating that she was not able to tolerate PO Metronidazole for her BV and would like to get Metronidazole gel if possible.  Patient states that she vomited the po Metronidazole over the weekend.  Vandazole 0.75% gel 1 app qhs for 5 nights given to patient.  The patient was dispensed Vandazole today. I provided counseling today regarding the medication. We discussed the medication, the side effects and when to call clinic. Patient given the opportunity to ask questions. Questions answered. Enc patient to use OTC antifungal cream if has itching after using antibiotic gel.

## 2020-06-30 NOTE — Addendum Note (Signed)
Addended by: Sadie Haber on: 06/30/2020 08:56 AM   Modules accepted: Orders

## 2020-08-03 ENCOUNTER — Other Ambulatory Visit: Payer: Self-pay

## 2020-08-04 ENCOUNTER — Ambulatory Visit (LOCAL_COMMUNITY_HEALTH_CENTER): Payer: Self-pay

## 2020-08-04 ENCOUNTER — Other Ambulatory Visit: Payer: Self-pay

## 2020-08-04 DIAGNOSIS — Z111 Encounter for screening for respiratory tuberculosis: Secondary | ICD-10-CM

## 2020-08-07 ENCOUNTER — Ambulatory Visit (LOCAL_COMMUNITY_HEALTH_CENTER): Payer: Self-pay

## 2020-08-07 ENCOUNTER — Other Ambulatory Visit: Payer: Self-pay

## 2020-08-07 DIAGNOSIS — Z111 Encounter for screening for respiratory tuberculosis: Secondary | ICD-10-CM

## 2020-08-07 LAB — TB SKIN TEST
Induration: 0 mm
TB Skin Test: NEGATIVE

## 2020-10-09 ENCOUNTER — Other Ambulatory Visit: Payer: Self-pay

## 2020-10-09 DIAGNOSIS — Z20822 Contact with and (suspected) exposure to covid-19: Secondary | ICD-10-CM

## 2020-10-11 LAB — NOVEL CORONAVIRUS, NAA: SARS-CoV-2, NAA: NOT DETECTED

## 2020-10-11 LAB — SARS-COV-2, NAA 2 DAY TAT

## 2021-01-14 ENCOUNTER — Telehealth: Payer: Self-pay | Admitting: Family Medicine

## 2021-01-14 NOTE — Telephone Encounter (Signed)
Patient called because she wants be seen ASAP/tomorrow Friday the 22nd. "I'm bleeding through everything!"  Since she has had very heavy bleeding, she thought that maybe she was due to change her Nexplanon implant. Boneta Lucks looked up the info on [facility] and confirmed that her removal would be for next month. Patient is very drained and wants to get this taken care of ASAP.  Thank you!

## 2021-06-22 ENCOUNTER — Encounter: Payer: Self-pay | Admitting: Physician Assistant

## 2021-06-22 ENCOUNTER — Other Ambulatory Visit: Payer: Self-pay

## 2021-06-22 ENCOUNTER — Ambulatory Visit: Payer: Self-pay | Admitting: Physician Assistant

## 2021-06-22 DIAGNOSIS — N760 Acute vaginitis: Secondary | ICD-10-CM

## 2021-06-22 DIAGNOSIS — B9689 Other specified bacterial agents as the cause of diseases classified elsewhere: Secondary | ICD-10-CM

## 2021-06-22 DIAGNOSIS — Z113 Encounter for screening for infections with a predominantly sexual mode of transmission: Secondary | ICD-10-CM

## 2021-06-22 LAB — WET PREP FOR TRICH, YEAST, CLUE
Clue Cell Exam: POSITIVE — AB
Trichomonas Exam: NEGATIVE
Yeast Exam: NEGATIVE

## 2021-06-22 LAB — HM HIV SCREENING LAB: HM HIV Screening: NEGATIVE

## 2021-06-22 MED ORDER — METRONIDAZOLE 500 MG PO TABS: 500.0000 mg | ORAL_TABLET | Freq: Two times a day (BID) | ORAL | 0 refills | Status: AC

## 2021-06-22 NOTE — Progress Notes (Signed)
[facility] Department STI clinic/screening visit  Subjective:  Melody Li is a 35 y.o. female being seen today for an STI screening visit. The patient reports they do have symptoms.  Patient reports that they do not desire a pregnancy in the next year.   They reported they are not interested in discussing contraception today.  No LMP recorded. Patient has had an implant.   Patient has the following medical conditions:  There are no problems to display for this patient.   Chief Complaint  Patient presents with   SEXUALLY TRANSMITTED DISEASE    screening    HPI  Patient reports that she has had a white discharge with an odor for 2 weeks.  Denies chronic conditions, surgeries and regular medicines.  LMP is irregular and using Nexplanon as BCM.  States last HIV test was last year and last pap was about 4 years ago.    See flowsheet for further details and programmatic requirements.    The following portions of the patient's history were reviewed and updated as appropriate: allergies, current medications, past medical history, past social history, past surgical history and problem list.  Objective:  There were no vitals filed for this visit.  Physical Exam Constitutional:      General: She is not in acute distress.    Appearance: Normal appearance.  HENT:     Head: Normocephalic and atraumatic.     Comments: No nits,lice, or hair loss. No cervical, supraclavicular or axillary adenopathy.     Mouth/Throat:     Mouth: Mucous membranes are moist.     Pharynx: Oropharynx is clear. No oropharyngeal exudate or posterior oropharyngeal erythema.  Eyes:     Conjunctiva/sclera: Conjunctivae normal.  Pulmonary:     Effort: Pulmonary effort is normal.  Abdominal:     Palpations: Abdomen is soft. There is no mass.     Tenderness: There is no abdominal tenderness. There is no guarding or rebound.  Genitourinary:    General: Normal vulva.     Rectum: Normal.      Comments: External genitalia/pubic area without nits, lice, edema, erythema, lesions and inguinal adenopathy. Vagina with normal mucosa and moderate amount of thin, white discharge, pH=>4.5. Cervix without visible lesions. Uterus firm, mobile, nt, no masses, no CMT, no adnexal tenderness or fullness.  Musculoskeletal:     Cervical back: Neck supple. No tenderness.  Skin:    General: Skin is warm and dry.     Findings: No bruising, erythema, lesion or rash.  Neurological:     Mental Status: She is alert and oriented to person, place, and time.  Psychiatric:        Mood and Affect: Mood normal.        Behavior: Behavior normal.        Thought Content: Thought content normal.        Judgment: Judgment normal.     Assessment and Plan:  Melody Li is a 35 y.o. female presenting to the [facility] Department for STI screening  1. Screening for STD (sexually transmitted disease) Patient into clinic with symptoms. Reviewed wet mount results with patient. Rec condoms with all sex. Await test results.  Counseled that RN will call if needs to RTC for treatment once results are back.  - WET PREP FOR TRICH, YEAST, CLUE - Chlamydia/Gonorrhea [place] [facility] - HIV [place] [facility] - Syphilis Serology, [place] [facility]  2. BV (bacterial vaginosis) Treat BV with Metronidazole 500 mg #14  1 po BID for 7 days with food, no EtOH for 24 hr before and until 72 hr after completing medicine. No sex for 10 days. Enc to use OTC antifungal cream if has itching during or just after antibiotic use.  - metroNIDAZOLE (FLAGYL) 500 MG tablet; Take 1 tablet (500 mg total) by mouth 2 (two) times daily for 7 days.  Dispense: 14 tablet; Refill: 0     No follow-ups on file.  No future appointments.  Matt Holmes, PA

## 2021-06-24 NOTE — Progress Notes (Signed)
Chart reviewed by Pharmacist  Suzanne Walker PharmD, Contract Pharmacist at [facility]  

## 2021-09-22 ENCOUNTER — Telehealth: Payer: Self-pay | Admitting: Family Medicine

## 2021-09-22 NOTE — Telephone Encounter (Signed)
Patient was seen an given a treatment for symptom and it has reoccurred needs to be speak with a nurse urgent.

## 2022-02-02 ENCOUNTER — Ambulatory Visit: Payer: Self-pay

## 2022-02-02 ENCOUNTER — Encounter: Payer: Self-pay | Admitting: Advanced Practice Midwife

## 2022-02-02 ENCOUNTER — Ambulatory Visit (LOCAL_COMMUNITY_HEALTH_CENTER): Payer: Self-pay | Admitting: Advanced Practice Midwife

## 2022-02-02 VITALS — BP 141/85 | Ht 63.0 in | Wt 171.8 lb

## 2022-02-02 DIAGNOSIS — F1210 Cannabis abuse, uncomplicated: Secondary | ICD-10-CM

## 2022-02-02 DIAGNOSIS — R030 Elevated blood-pressure reading, without diagnosis of hypertension: Secondary | ICD-10-CM

## 2022-02-02 DIAGNOSIS — F17200 Nicotine dependence, unspecified, uncomplicated: Secondary | ICD-10-CM

## 2022-02-02 DIAGNOSIS — Z3009 Encounter for other general counseling and advice on contraception: Secondary | ICD-10-CM

## 2022-02-02 DIAGNOSIS — B379 Candidiasis, unspecified: Secondary | ICD-10-CM

## 2022-02-02 DIAGNOSIS — F319 Bipolar disorder, unspecified: Secondary | ICD-10-CM

## 2022-02-02 DIAGNOSIS — E669 Obesity, unspecified: Secondary | ICD-10-CM | POA: Insufficient documentation

## 2022-02-02 DIAGNOSIS — Z3046 Encounter for surveillance of implantable subdermal contraceptive: Secondary | ICD-10-CM

## 2022-02-02 LAB — WET PREP FOR TRICH, YEAST, CLUE: Trichomonas Exam: NEGATIVE

## 2022-02-02 LAB — HM HIV SCREENING LAB: HM HIV Screening: NEGATIVE

## 2022-02-02 MED ORDER — ETONOGESTREL 68 MG ~~LOC~~ IMPL
68.0000 mg | DRUG_IMPLANT | Freq: Once | SUBCUTANEOUS | Status: AC
  Administered 2022-02-02: 68 mg via SUBCUTANEOUS

## 2022-02-02 MED ORDER — CLOTRIMAZOLE 1 % VA CREA: 1.0000 | TOPICAL_CREAM | Freq: Every day | VAGINAL | 0 refills | Status: AC
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
## 2022-02-02 NOTE — Progress Notes (Signed)
Wet mount reviewed, patient treated for yeast per SO..Emmarie Sannes Brewer-Jensen, RN 

## 2022-02-02 NOTE — Progress Notes (Signed)
Patient here today for PE and Nexplanon removal/reinsertion. Last PE and Nexplanon placement 01/13/2018. Last Pap results found were in 2015, NIL. Patient states she has had a pap more recently at [facility], unable to find any more Pap results.Burt Knack, RN  ?

## 2022-02-12 LAB — IGP, APTIMA HPV
HPV Aptima: NEGATIVE
PAP Smear Comment: 0

## 2022-02-13 IMAGING — CR DG CHEST 2V
2 series · 2 of 2 positions shown · non-contrast
Comparison: November 11, 2011.

CLINICAL DATA: Cough.

EXAM:
CHEST - 2 VIEW

[chest pa]
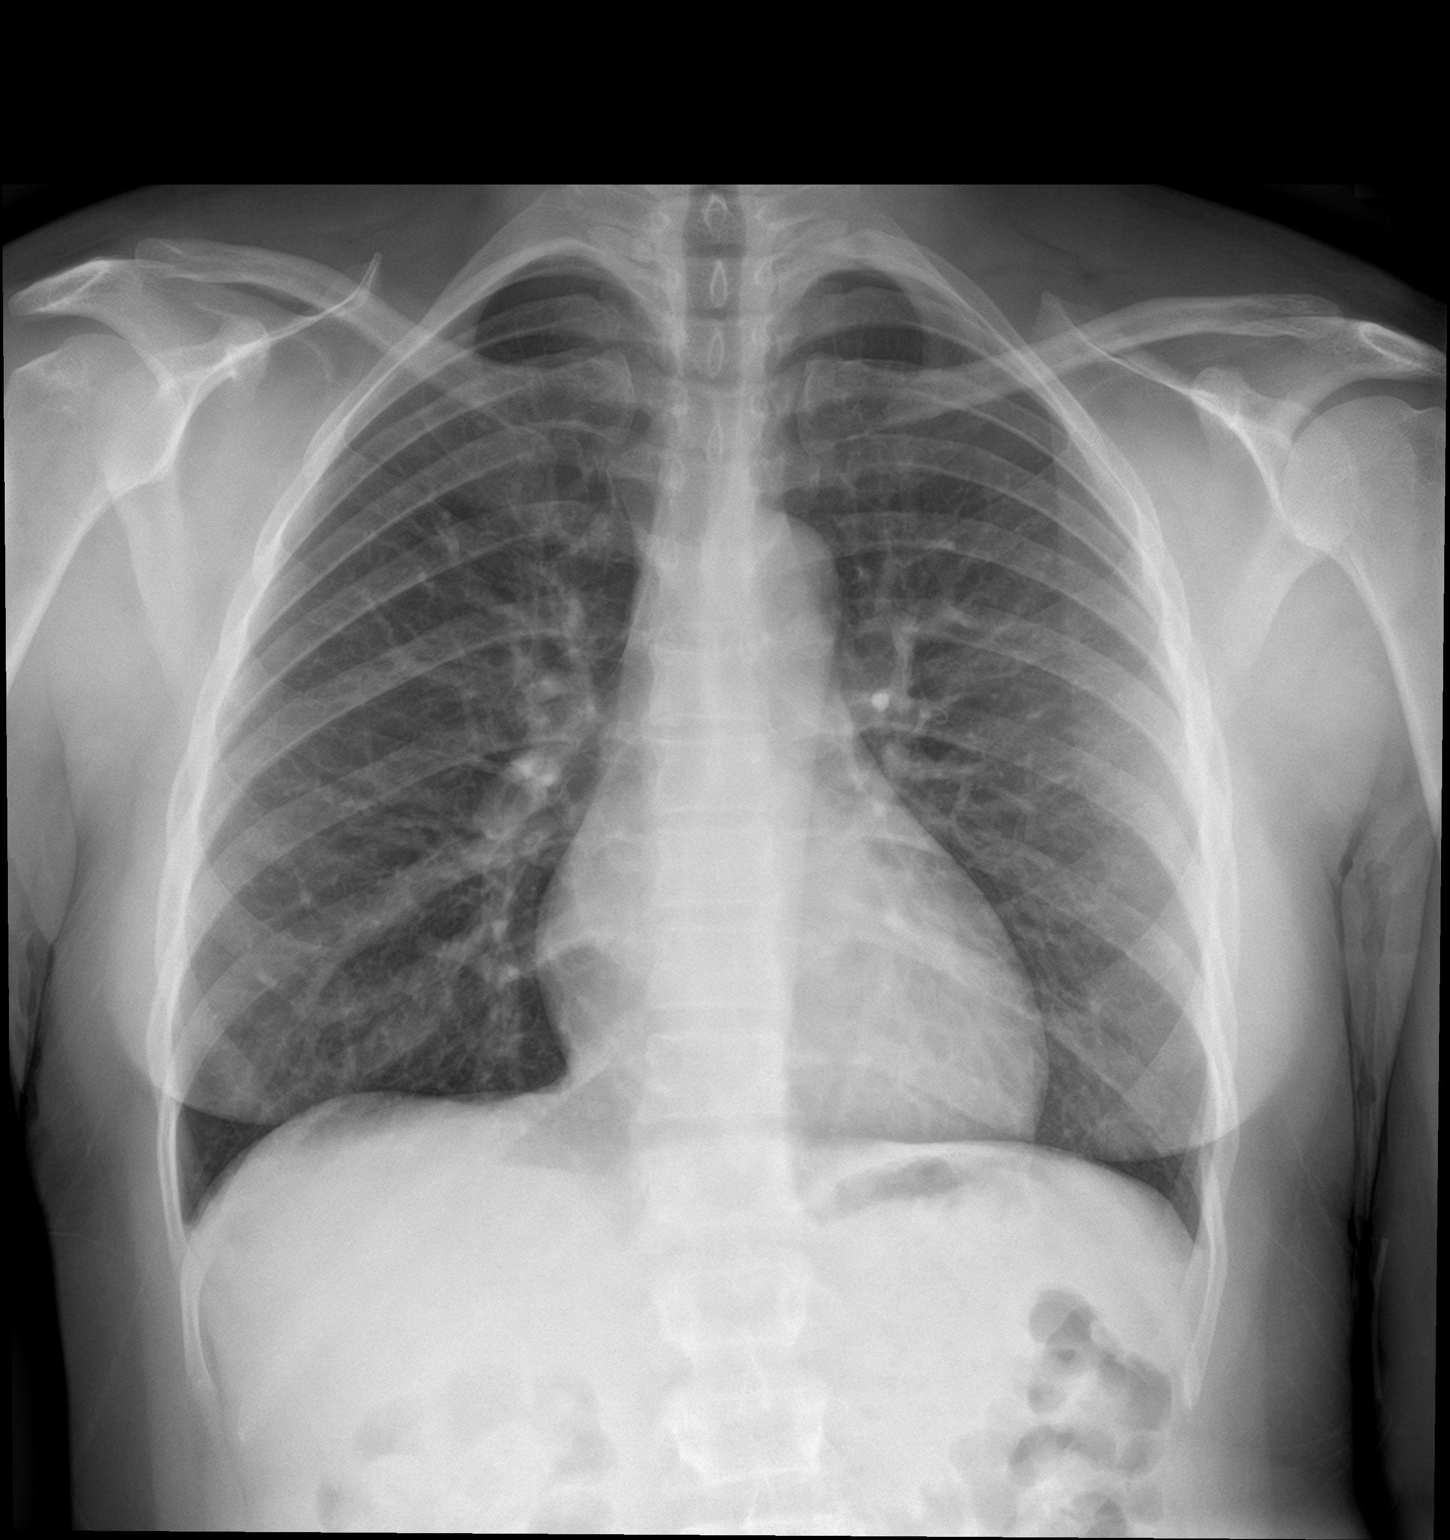

[chest lat]
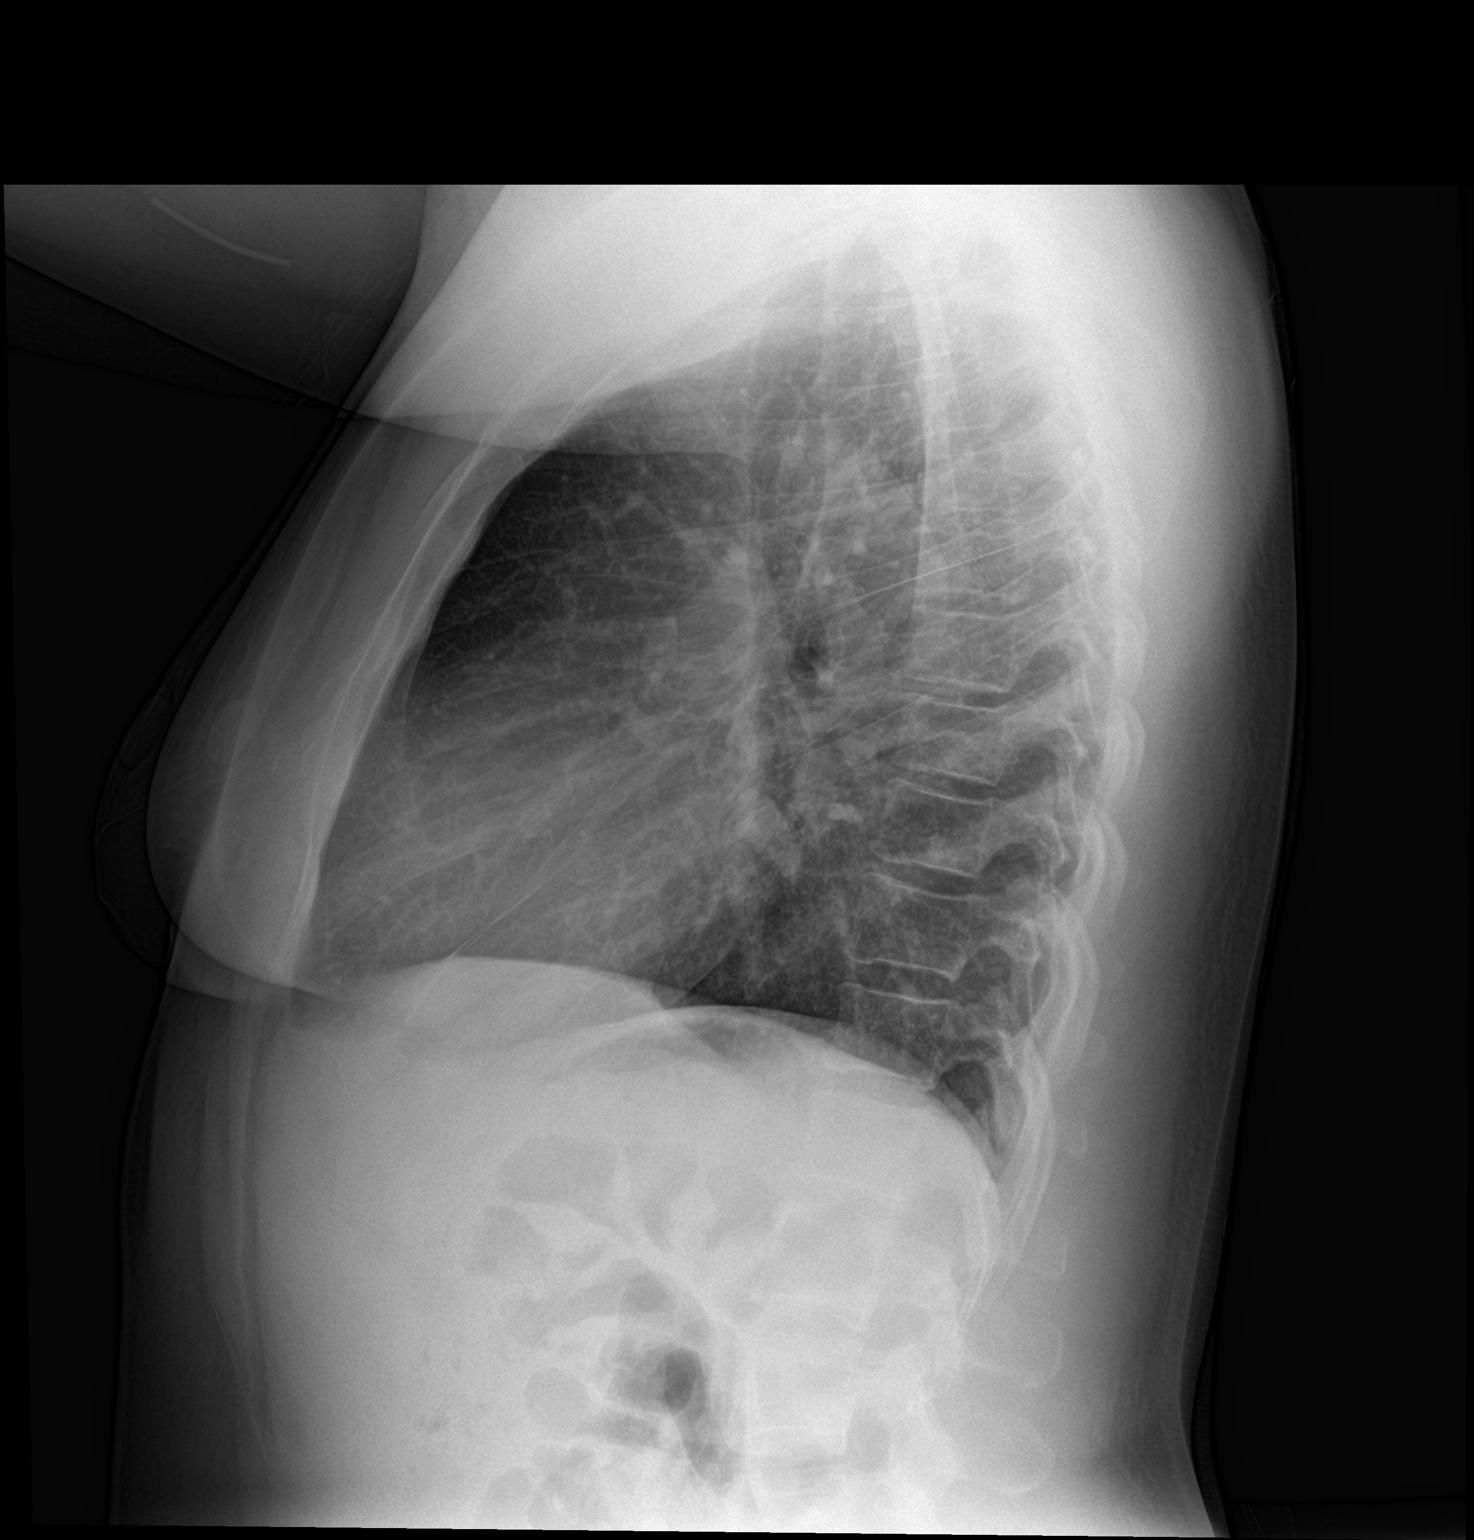

[2 of 2 positions shown; findings below may reference images not displayed]

FINDINGS: The heart size and mediastinal contours are within normal limits.
Both lungs are clear. The visualized skeletal structures are
unremarkable.
IMPRESSION: No active cardiopulmonary disease.

## 2022-02-15 ENCOUNTER — Encounter: Payer: Self-pay | Admitting: Advanced Practice Midwife

## 2022-02-15 DIAGNOSIS — R87619 Unspecified abnormal cytological findings in specimens from cervix uteri: Secondary | ICD-10-CM | POA: Insufficient documentation

## 2023-03-27 ENCOUNTER — Other Ambulatory Visit: Payer: Self-pay
